# Patient Record
Sex: Male | Born: 2000 | Race: White | Hispanic: No | Marital: Single | Smoking: Never smoker
Health system: Southern US, Community
[De-identification: ages and names within clinical notes are randomized; demographics above are authoritative.]

## PROBLEM LIST (undated history)

## (undated) DIAGNOSIS — J45909 Unspecified asthma, uncomplicated: Secondary | ICD-10-CM

## (undated) DIAGNOSIS — J302 Other seasonal allergic rhinitis: Secondary | ICD-10-CM

---
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
## 2019-07-05 IMAGING — DX DG CHEST 2V
2 series · 2 of 2 positions shown · non-contrast
Comparison: None.

CLINICAL DATA: Cough and chest congestion with myalgias for the
past 3 weeks.

EXAM:
CHEST - 2 VIEW

[chest pa]
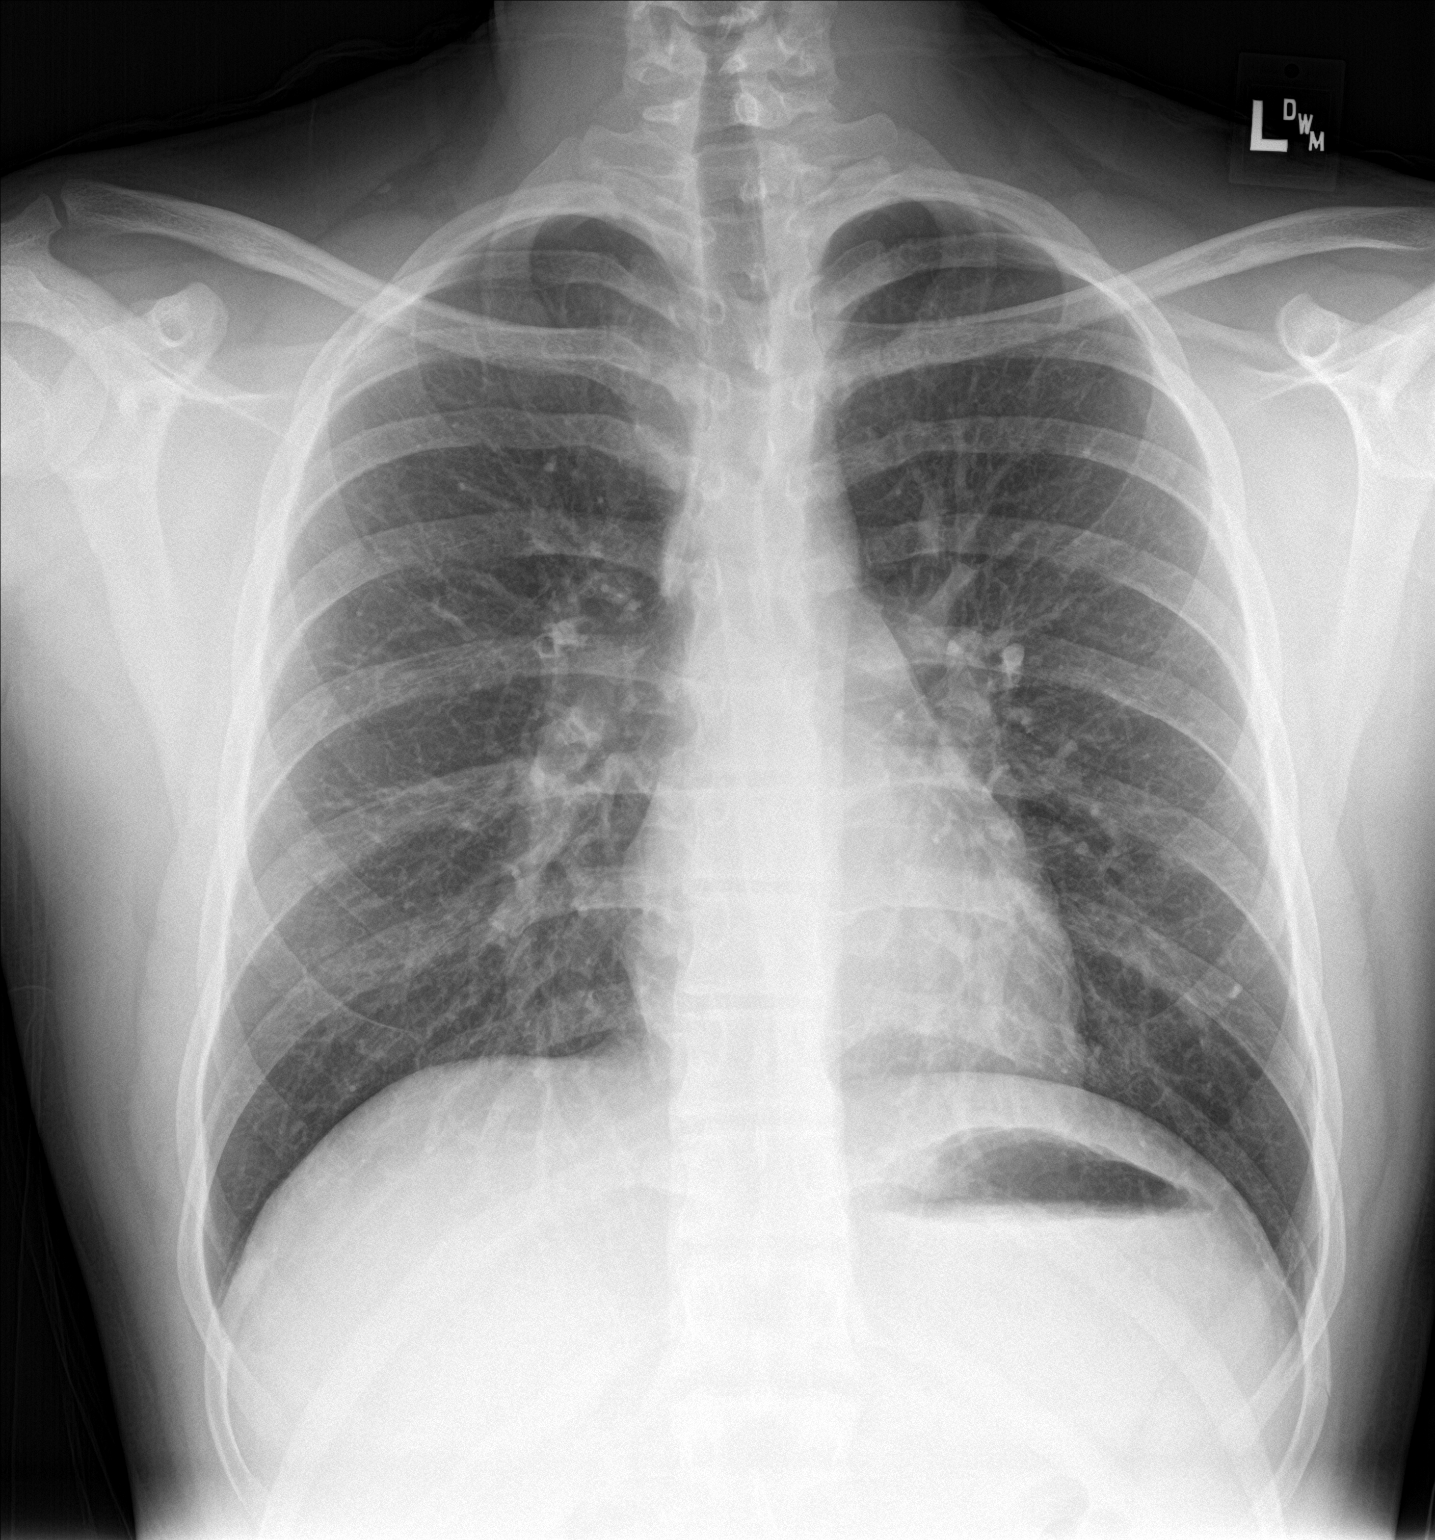

[chest lat]
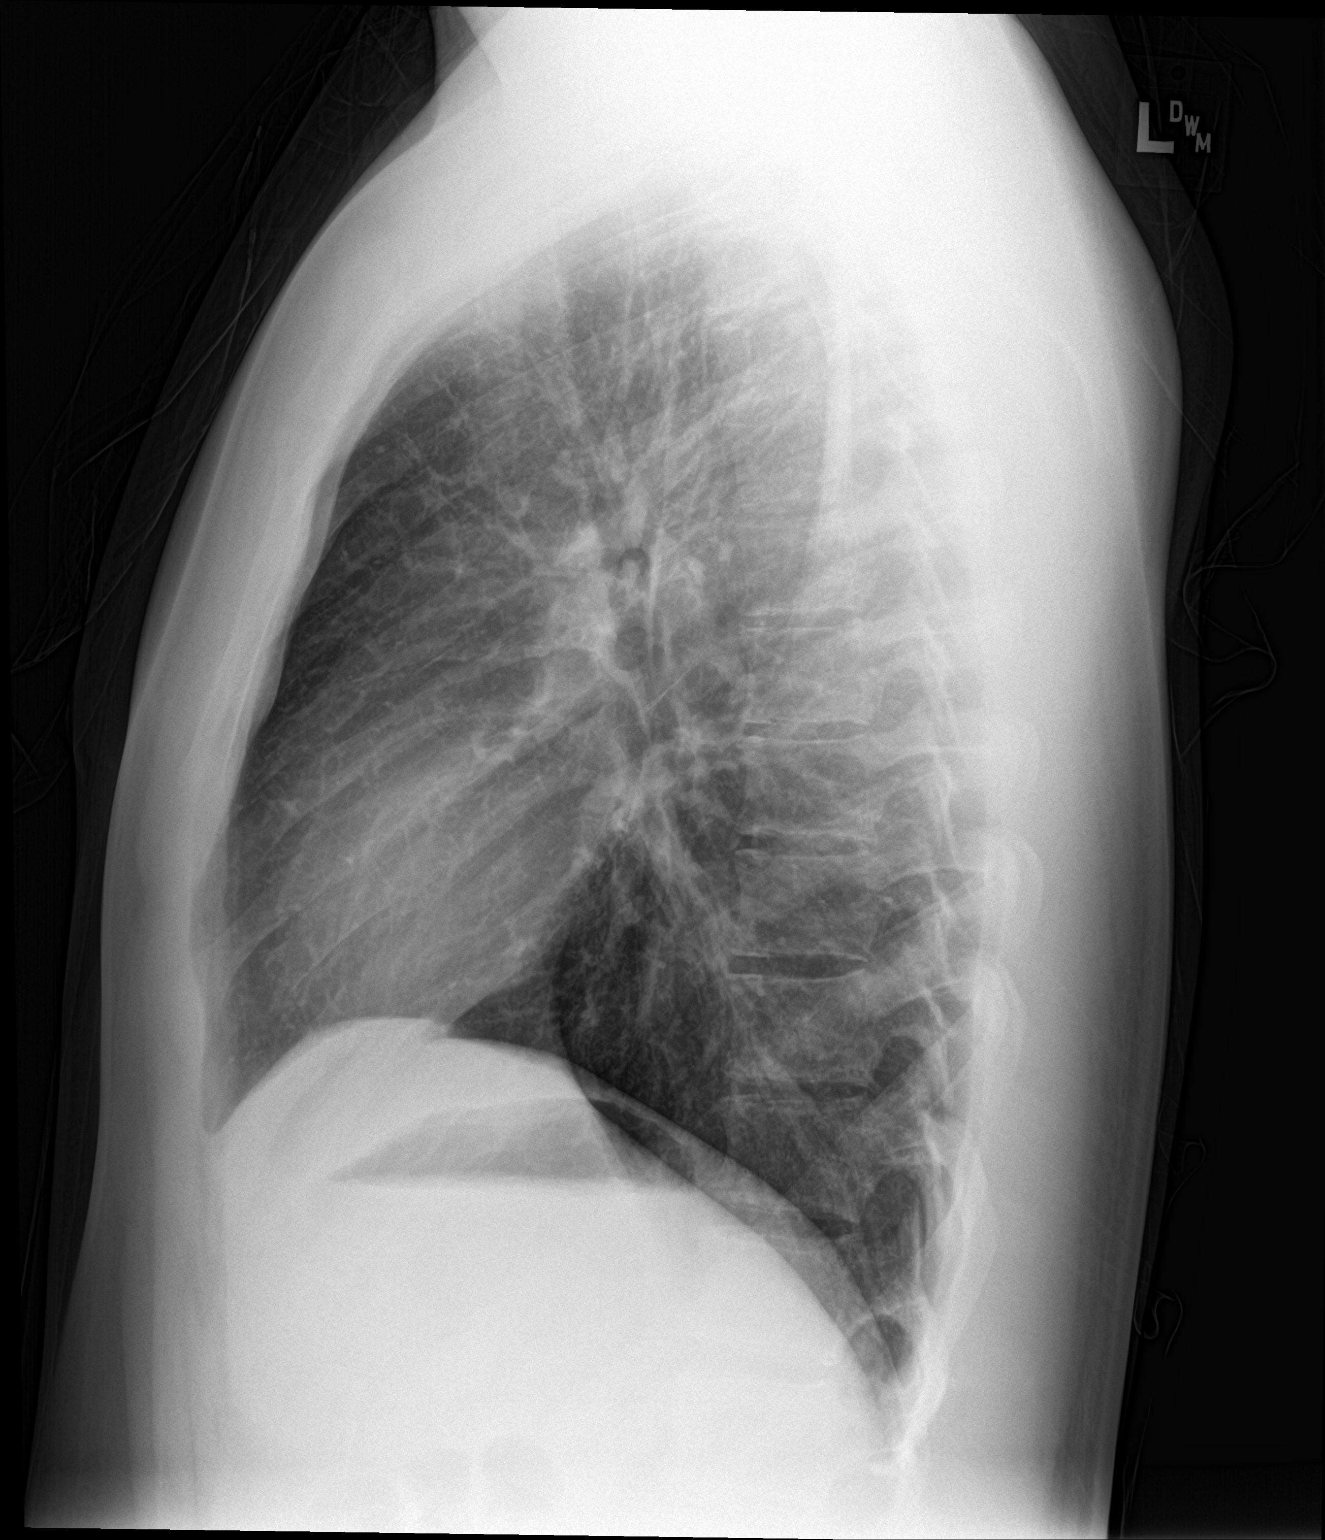

[2 of 2 positions shown; findings below may reference images not displayed]

FINDINGS: Normal sized heart. Clear lungs. Mild-to-moderate diffuse
peribronchial thickening. Normal appearing bones.
IMPRESSION: Mild to moderate bronchitic changes.
